# Patient Record
Sex: Female | Born: 1950 | Race: Black or African American | Hispanic: No | Smoking: Never smoker
Health system: Southern US, Community
[De-identification: ages and names within clinical notes are randomized; demographics above are authoritative.]

## PROBLEM LIST (undated history)

## (undated) DIAGNOSIS — K5792 Diverticulitis of intestine, part unspecified, without perforation or abscess without bleeding: Secondary | ICD-10-CM

## (undated) DIAGNOSIS — R911 Solitary pulmonary nodule: Secondary | ICD-10-CM

## (undated) DIAGNOSIS — M1990 Unspecified osteoarthritis, unspecified site: Secondary | ICD-10-CM

## (undated) DIAGNOSIS — I10 Essential (primary) hypertension: Secondary | ICD-10-CM

## (undated) HISTORY — PX: EAR CYST EXCISION: SHX22

## (undated) HISTORY — PX: TONSILLECTOMY: SUR1361

---

## 2009-03-15 ENCOUNTER — Emergency Department (HOSPITAL_BASED_OUTPATIENT_CLINIC_OR_DEPARTMENT_OTHER): Admission: EM | Admit: 2009-03-15 | Discharge: 2009-03-15 | Payer: Self-pay | Admitting: Emergency Medicine

## 2010-05-09 ENCOUNTER — Emergency Department (INDEPENDENT_AMBULATORY_CARE_PROVIDER_SITE_OTHER): Payer: Self-pay

## 2010-05-09 ENCOUNTER — Emergency Department (HOSPITAL_BASED_OUTPATIENT_CLINIC_OR_DEPARTMENT_OTHER)
Admission: EM | Admit: 2010-05-09 | Discharge: 2010-05-09 | Disposition: A | Discharge status: Home / Self Care | Payer: Self-pay | Attending: Emergency Medicine | Admitting: Emergency Medicine

## 2010-05-09 DIAGNOSIS — M1710 Unilateral primary osteoarthritis, unspecified knee: Secondary | ICD-10-CM | POA: Insufficient documentation

## 2010-05-09 DIAGNOSIS — M25569 Pain in unspecified knee: Secondary | ICD-10-CM

## 2017-06-24 ENCOUNTER — Emergency Department (HOSPITAL_BASED_OUTPATIENT_CLINIC_OR_DEPARTMENT_OTHER)
Admission: EM | Admit: 2017-06-24 | Discharge: 2017-06-24 | Disposition: A | Discharge status: Home / Self Care | Payer: Medicare Other | Attending: Physician Assistant | Admitting: Physician Assistant

## 2017-06-24 ENCOUNTER — Encounter (HOSPITAL_BASED_OUTPATIENT_CLINIC_OR_DEPARTMENT_OTHER): Payer: Self-pay

## 2017-06-24 ENCOUNTER — Other Ambulatory Visit: Payer: Self-pay

## 2017-06-24 ENCOUNTER — Emergency Department (HOSPITAL_BASED_OUTPATIENT_CLINIC_OR_DEPARTMENT_OTHER): Payer: Medicare Other

## 2017-06-24 DIAGNOSIS — M25561 Pain in right knee: Secondary | ICD-10-CM | POA: Insufficient documentation

## 2017-06-24 DIAGNOSIS — M25551 Pain in right hip: Secondary | ICD-10-CM | POA: Diagnosis not present

## 2017-06-24 MED ORDER — IBUPROFEN 400 MG PO TABS
600.0000 mg | ORAL_TABLET | Freq: Once | ORAL | Status: AC
  Administered 2017-06-24: 600 mg via ORAL
  Filled 2017-06-24: qty 1

## 2017-06-24 NOTE — Discharge Instructions (Addendum)
Please read instructions below. Apply ice to your hip and knee for 20 minutes at a time. Elevate your leg when possible. You can take advil every 6 hours as needed for pain. Schedule an appointment with primary care to discuss your slow heart rate.  Return to the ER for lightheadedness or weakness, chest pain, or new or concerning symptoms.
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
## 2017-06-24 NOTE — ED Notes (Signed)
Patient transported to X-ray 

## 2017-06-24 NOTE — ED Notes (Signed)
Pt's family member concerned about the slow HR. Pt denies any CP, lightheadedness, ShOB, or nausea.
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
## 2019-10-12 IMAGING — CR DG HIP (WITH OR WITHOUT PELVIS) 2-3V*R*
3 series · 3 of 3 positions shown · non-contrast
Comparison: None.

CLINICAL DATA: RIGHT hip and groin pain after bumping into
grandson.

EXAM:
DG HIP (WITH OR WITHOUT PELVIS) 2-3V RIGHT

[t pelvis a.p.]
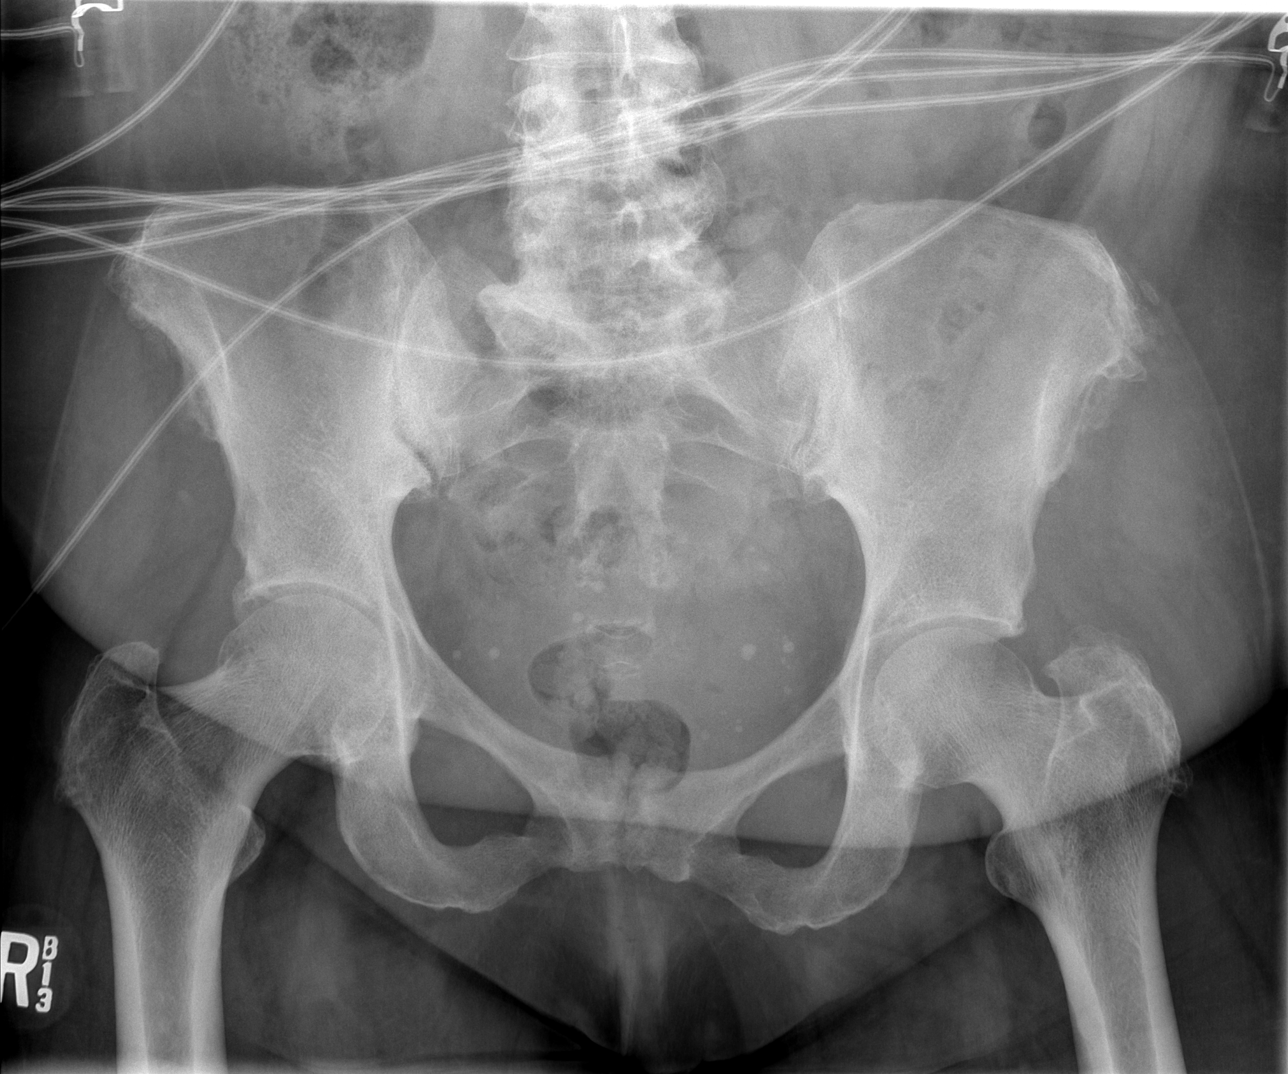

[t hip ap right]
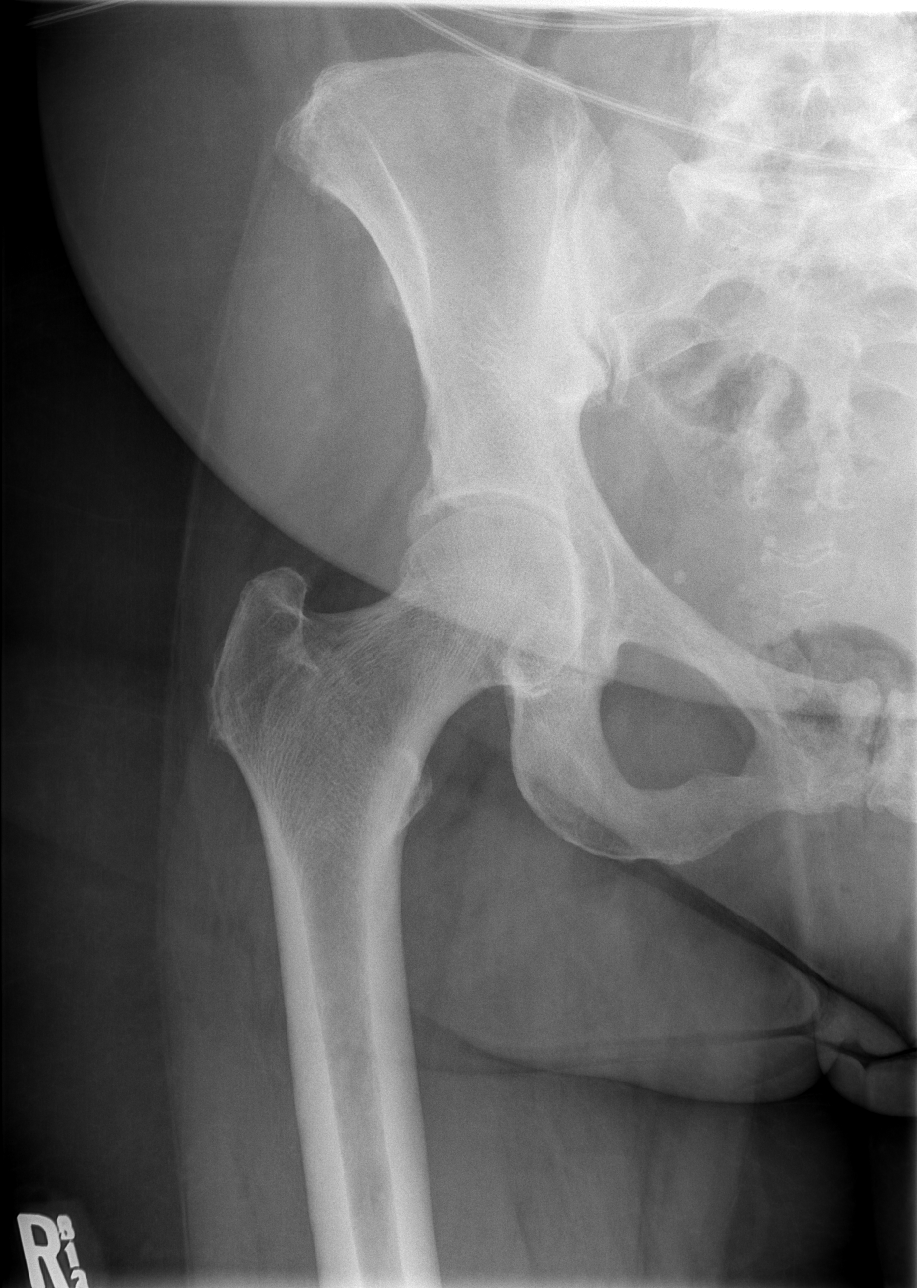

[t hip frog leg right]
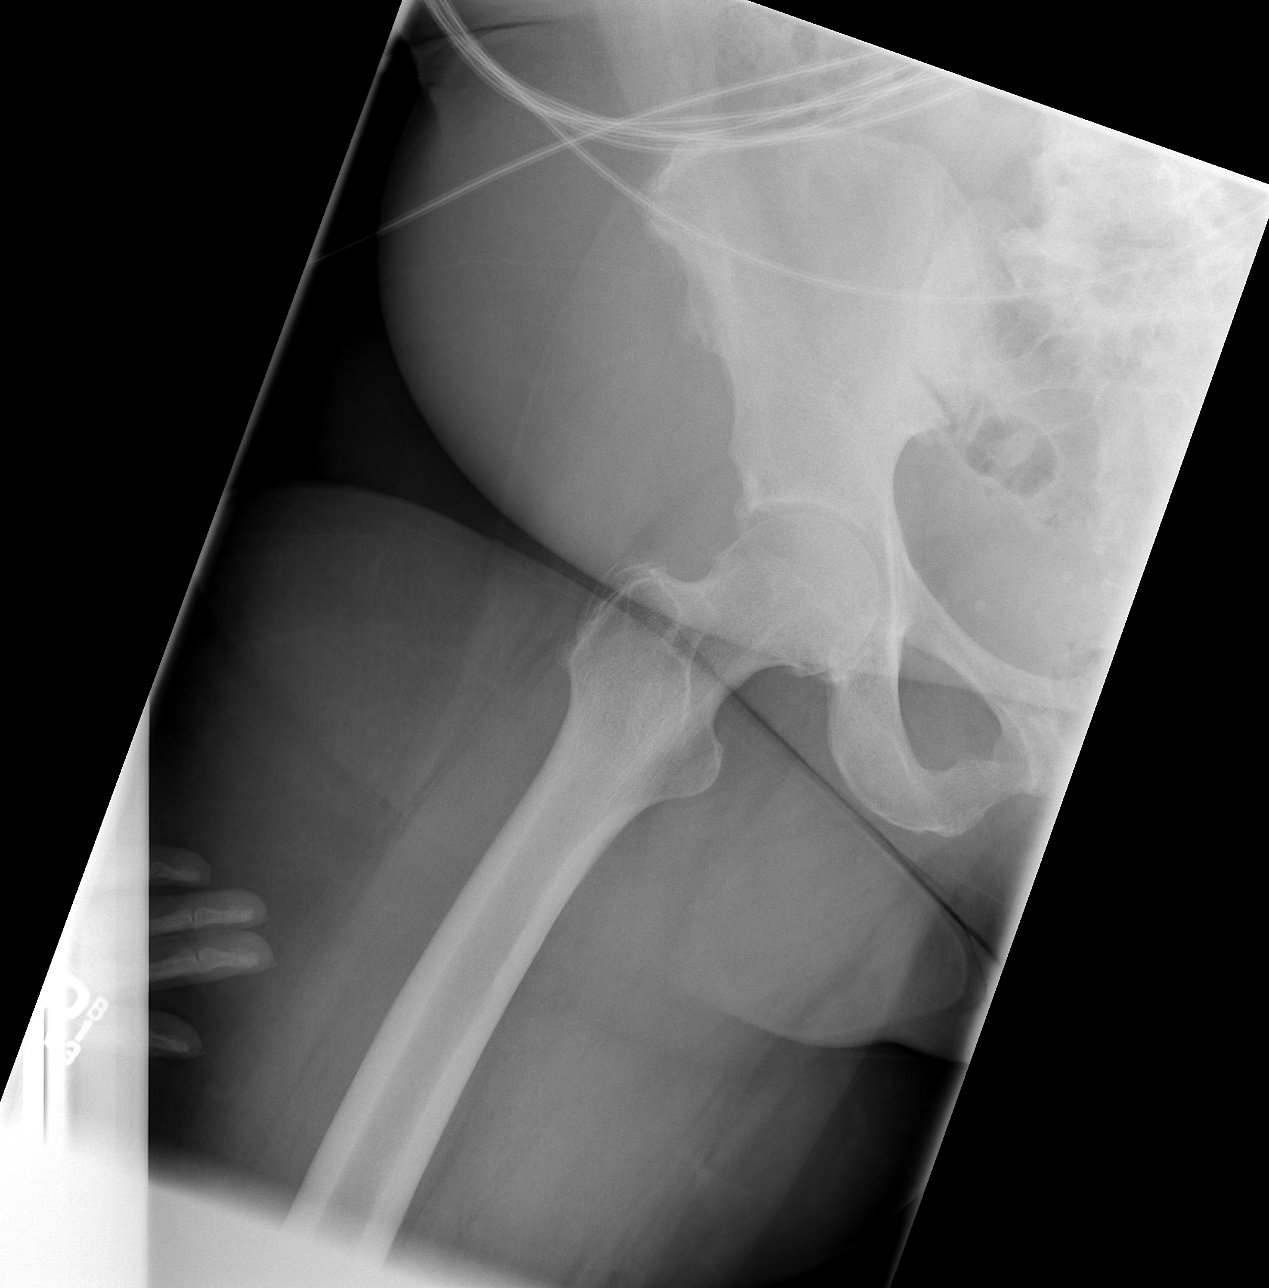

[3 of 3 positions shown; findings below may reference images not displayed]

FINDINGS: There is no evidence of hip fracture or dislocation. Mild sacroiliac
osteoarthrosis. Advanced pubic symphysis osteoarthrosis. There is no
evidence of arthropathy or other focal bone abnormality. Phleboliths
project in the pelvis.
IMPRESSION: Negative.

## 2020-05-14 ENCOUNTER — Other Ambulatory Visit: Payer: Self-pay | Admitting: Orthopedic Surgery

## 2020-05-15 ENCOUNTER — Encounter (HOSPITAL_BASED_OUTPATIENT_CLINIC_OR_DEPARTMENT_OTHER): Payer: Self-pay | Admitting: Orthopedic Surgery

## 2020-05-15 ENCOUNTER — Other Ambulatory Visit: Payer: Self-pay

## 2020-05-18 ENCOUNTER — Other Ambulatory Visit (HOSPITAL_COMMUNITY)
Admission: RE | Admit: 2020-05-18 | Discharge: 2020-05-18 | Disposition: A | Discharge status: Home / Self Care | Payer: Medicare Other | Source: Ambulatory Visit | Attending: Orthopedic Surgery | Admitting: Orthopedic Surgery

## 2020-05-18 DIAGNOSIS — Z01812 Encounter for preprocedural laboratory examination: Secondary | ICD-10-CM | POA: Diagnosis present

## 2020-05-18 DIAGNOSIS — Z20822 Contact with and (suspected) exposure to covid-19: Secondary | ICD-10-CM | POA: Diagnosis not present

## 2020-05-19 LAB — SARS CORONAVIRUS 2 (TAT 6-24 HRS): SARS Coronavirus 2: NEGATIVE

## 2020-05-22 ENCOUNTER — Encounter (HOSPITAL_BASED_OUTPATIENT_CLINIC_OR_DEPARTMENT_OTHER): Payer: Self-pay | Admitting: Orthopedic Surgery

## 2020-05-22 ENCOUNTER — Encounter (HOSPITAL_BASED_OUTPATIENT_CLINIC_OR_DEPARTMENT_OTHER)
Admission: RE | Disposition: A | Discharge status: Home / Self Care | Payer: Self-pay | Source: Home / Self Care | Attending: Orthopedic Surgery

## 2020-05-22 ENCOUNTER — Other Ambulatory Visit: Payer: Self-pay

## 2020-05-22 ENCOUNTER — Ambulatory Visit (HOSPITAL_BASED_OUTPATIENT_CLINIC_OR_DEPARTMENT_OTHER): Payer: Medicare Other | Admitting: Anesthesiology

## 2020-05-22 ENCOUNTER — Ambulatory Visit (HOSPITAL_BASED_OUTPATIENT_CLINIC_OR_DEPARTMENT_OTHER)
Admission: RE | Admit: 2020-05-22 | Discharge: 2020-05-22 | Disposition: A | Discharge status: Home / Self Care | Payer: Medicare Other | Attending: Orthopedic Surgery | Admitting: Orthopedic Surgery

## 2020-05-22 DIAGNOSIS — M67431 Ganglion, right wrist: Secondary | ICD-10-CM | POA: Insufficient documentation

## 2020-05-22 DIAGNOSIS — Z885 Allergy status to narcotic agent status: Secondary | ICD-10-CM | POA: Insufficient documentation

## 2020-05-22 HISTORY — DX: Unspecified osteoarthritis, unspecified site: M19.90

## 2020-05-22 HISTORY — DX: Essential (primary) hypertension: I10

## 2020-05-22 HISTORY — DX: Solitary pulmonary nodule: R91.1

## 2020-05-22 HISTORY — PX: GANGLION CYST EXCISION: SHX1691

## 2020-05-22 HISTORY — DX: Diverticulitis of intestine, part unspecified, without perforation or abscess without bleeding: K57.92

## 2020-05-22 SURGERY — EXCISION, GANGLION CYST, WRIST
Anesthesia: Monitor Anesthesia Care | Site: Wrist | Laterality: Right

## 2020-05-22 MED ORDER — PROPOFOL 10 MG/ML IV BOLUS
INTRAVENOUS | Status: DC | PRN
  Administered 2020-05-22: 10 mg via INTRAVENOUS

## 2020-05-22 MED ORDER — GLYCOPYRROLATE 0.2 MG/ML IJ SOLN
INTRAMUSCULAR | Status: DC | PRN
  Administered 2020-05-22: .2 mg via INTRAVENOUS

## 2020-05-22 MED ORDER — FENTANYL CITRATE (PF) 100 MCG/2ML IJ SOLN
INTRAMUSCULAR | Status: DC | PRN
  Administered 2020-05-22 (×2): 50 ug via INTRAVENOUS

## 2020-05-22 MED ORDER — CEFAZOLIN SODIUM-DEXTROSE 2-4 GM/100ML-% IV SOLN
INTRAVENOUS | Status: AC
  Filled 2020-05-22: qty 100

## 2020-05-22 MED ORDER — CEFAZOLIN SODIUM-DEXTROSE 2-4 GM/100ML-% IV SOLN
2.0000 g | INTRAVENOUS | Status: AC
  Administered 2020-05-22: 2 g via INTRAVENOUS

## 2020-05-22 MED ORDER — ONDANSETRON HCL 4 MG/2ML IJ SOLN
INTRAMUSCULAR | Status: AC
  Filled 2020-05-22: qty 2

## 2020-05-22 MED ORDER — MIDAZOLAM HCL 5 MG/5ML IJ SOLN
INTRAMUSCULAR | Status: DC | PRN
  Administered 2020-05-22 (×2): 1 mg via INTRAVENOUS

## 2020-05-22 MED ORDER — LACTATED RINGERS IV SOLN: INTRAVENOUS | Status: DC

## 2020-05-22 MED ORDER — LIDOCAINE HCL (PF) 0.5 % IJ SOLN
INTRAMUSCULAR | Status: DC | PRN
  Administered 2020-05-22: 50 mL via INTRAVENOUS

## 2020-05-22 MED ORDER — MIDAZOLAM HCL 2 MG/2ML IJ SOLN
INTRAMUSCULAR | Status: AC
  Filled 2020-05-22: qty 2

## 2020-05-22 MED ORDER — FENTANYL CITRATE (PF) 100 MCG/2ML IJ SOLN
INTRAMUSCULAR | Status: AC
  Filled 2020-05-22: qty 2

## 2020-05-22 MED ORDER — PROPOFOL 500 MG/50ML IV EMUL
INTRAVENOUS | Status: DC | PRN
  Administered 2020-05-22: 50 ug/kg/min via INTRAVENOUS

## 2020-05-22 MED ORDER — TRAMADOL HCL 50 MG PO TABS: 50.0000 mg | ORAL_TABLET | Freq: Four times a day (QID) | ORAL | 0 refills | Status: AC | PRN

## 2020-05-22 MED ORDER — BUPIVACAINE HCL (PF) 0.25 % IJ SOLN
INTRAMUSCULAR | Status: DC | PRN
  Administered 2020-05-22: 5 mL

## 2020-05-22 MED ORDER — ONDANSETRON HCL 4 MG/2ML IJ SOLN
INTRAMUSCULAR | Status: DC | PRN
  Administered 2020-05-22: 4 mg via INTRAVENOUS

## 2020-05-22 SURGICAL SUPPLY — 39 items
BLADE MINI RND TIP GREEN BEAV (BLADE) IMPLANT
BLADE SURG 15 STRL LF DISP TIS (BLADE) ×1 IMPLANT
BLADE SURG 15 STRL SS (BLADE) ×1
BNDG COHESIVE 3X5 TAN STRL LF (GAUZE/BANDAGES/DRESSINGS) ×2 IMPLANT
BNDG ESMARK 4X9 LF (GAUZE/BANDAGES/DRESSINGS) IMPLANT
BNDG GAUZE ELAST 4 BULKY (GAUZE/BANDAGES/DRESSINGS) ×2 IMPLANT
CHLORAPREP W/TINT 26 (MISCELLANEOUS) ×2 IMPLANT
CORD BIPOLAR FORCEPS 12FT (ELECTRODE) ×2 IMPLANT
COVER BACK TABLE 60X90IN (DRAPES) ×2 IMPLANT
COVER MAYO STAND STRL (DRAPES) ×2 IMPLANT
COVER WAND RF STERILE (DRAPES) IMPLANT
CUFF TOURN SGL QUICK 18X4 (TOURNIQUET CUFF) IMPLANT
DECANTER SPIKE VIAL GLASS SM (MISCELLANEOUS) IMPLANT
DRAPE EXTREMITY T 121X128X90 (DISPOSABLE) ×2 IMPLANT
DRAPE SURG 17X23 STRL (DRAPES) ×2 IMPLANT
GAUZE SPONGE 4X4 12PLY STRL (GAUZE/BANDAGES/DRESSINGS) ×2 IMPLANT
GAUZE XEROFORM 1X8 LF (GAUZE/BANDAGES/DRESSINGS) ×2 IMPLANT
GLOVE SURG ORTHO LTX SZ8 (GLOVE) ×2 IMPLANT
GLOVE SURG UNDER POLY LF SZ8.5 (GLOVE) ×2 IMPLANT
GOWN STRL REUS W/ TWL LRG LVL3 (GOWN DISPOSABLE) ×1 IMPLANT
GOWN STRL REUS W/TWL LRG LVL3 (GOWN DISPOSABLE) ×1
GOWN STRL REUS W/TWL XL LVL3 (GOWN DISPOSABLE) ×2 IMPLANT
NEEDLE PRECISIONGLIDE 27X1.5 (NEEDLE) ×2 IMPLANT
NS IRRIG 1000ML POUR BTL (IV SOLUTION) ×2 IMPLANT
PACK BASIN DAY SURGERY FS (CUSTOM PROCEDURE TRAY) ×2 IMPLANT
PAD CAST 3X4 CTTN HI CHSV (CAST SUPPLIES) ×1 IMPLANT
PADDING CAST ABS 4INX4YD NS (CAST SUPPLIES) ×1
PADDING CAST ABS COTTON 4X4 ST (CAST SUPPLIES) ×1 IMPLANT
PADDING CAST COTTON 3X4 STRL (CAST SUPPLIES) ×1
SPLINT PLASTER CAST XFAST 3X15 (CAST SUPPLIES) ×5 IMPLANT
SPLINT PLASTER XTRA FASTSET 3X (CAST SUPPLIES) ×5
STOCKINETTE 4X48 STRL (DRAPES) ×2 IMPLANT
SUT ETHILON 4 0 PS 2 18 (SUTURE) ×2 IMPLANT
SUT VIC AB 4-0 P2 18 (SUTURE) IMPLANT
SUT VICRYL 4-0 PS2 18IN ABS (SUTURE) IMPLANT
SYR BULB EAR ULCER 3OZ GRN STR (SYRINGE) ×2 IMPLANT
SYR CONTROL 10ML LL (SYRINGE) ×2 IMPLANT
TOWEL GREEN STERILE FF (TOWEL DISPOSABLE) ×4 IMPLANT
UNDERPAD 30X36 HEAVY ABSORB (UNDERPADS AND DIAPERS) ×2 IMPLANT

## 2020-05-22 NOTE — Anesthesia Preprocedure Evaluation (Signed)
Anesthesia Evaluation  Patient identified by MRN, date of birth, ID band Patient awake    Reviewed: Allergy & Precautions, NPO status , Patient's Chart, lab work & pertinent test results  History of Anesthesia Complications Negative for: history of anesthetic complications  Airway Mallampati: II  TM Distance: >3 FB Neck ROM: Full    Dental   Pulmonary neg pulmonary ROS,    Pulmonary exam normal        Cardiovascular hypertension, Normal cardiovascular exam     Neuro/Psych negative neurological ROS     GI/Hepatic negative GI ROS, Neg liver ROS,   Endo/Other  negative endocrine ROS  Renal/GU negative Renal ROS  negative genitourinary   Musculoskeletal  (+) Arthritis ,   Abdominal   Peds  Hematology negative hematology ROS (+)   Anesthesia Other Findings   Reproductive/Obstetrics                            Anesthesia Physical Anesthesia Plan  ASA: II  Anesthesia Plan: MAC and Bier Block and Bier Block-LIDOCAINE ONLY   Post-op Pain Management:    Induction: Intravenous  PONV Risk Score and Plan: 2 and Propofol infusion, TIVA and Treatment may vary due to age or medical condition  Airway Management Planned: Natural Airway, Nasal Cannula and Simple Face Mask  Additional Equipment: None  Intra-op Plan:   Post-operative Plan:   Informed Consent: I have reviewed the patients History and Physical, chart, labs and discussed the procedure including the risks, benefits and alternatives for the proposed anesthesia with the patient or authorized representative who has indicated his/her understanding and acceptance.       Plan Discussed with:   Anesthesia Plan Comments:         Anesthesia Quick Evaluation

## 2020-05-22 NOTE — Discharge Instructions (Addendum)

## 2020-05-22 NOTE — Transfer of Care (Signed)
Immediate Anesthesia Transfer of Care Note  Patient: Monica Frederick  Procedure(s) Performed: EXCISION VOLAR MASS RIGHT WRIST (Right Wrist)  Patient Location: PACU  Anesthesia Type:MAC and Bier block  Level of Consciousness: sedated  Airway & Oxygen Therapy: Patient Spontanous Breathing and Patient connected to face mask oxygen  Post-op Assessment: Report given to RN and Post -op Vital signs reviewed and stable  Post vital signs: Reviewed and stable  Last Vitals:  Vitals Value Taken Time  BP 130/65 05/22/20 1215  Temp    Pulse 44 05/22/20 1216  Resp 18 05/22/20 1216  SpO2 100 % 05/22/20 1216  Vitals shown include unvalidated device data.  Last Pain:  Vitals:   05/22/20 1025  TempSrc: Oral  PainSc: 0-No pain         Complications: No complications documented.
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
## 2020-05-22 NOTE — Anesthesia Procedure Notes (Signed)
Anesthesia Regional Block: Bier block (IV Regional)   Pre-Anesthetic Checklist: ,, timeout performed, Correct Patient, Correct Site, Correct Laterality, Correct Procedure,, site marked, surgical consent,, at surgeon's request  Laterality: Right     Needles:  Injection technique: Single-shot  Needle Type: Other      Needle Gauge: 20     Additional Needles:   Procedures:,,,,, intact distal pulses, Esmarch exsanguination, single tourniquet utilized,  Narrative:  Start time: 05/22/2020 11:30 AM End time: 05/22/2020 11:30 AM  Performed by: Personally

## 2020-05-22 NOTE — Anesthesia Postprocedure Evaluation (Signed)
Anesthesia Post Note  Patient: Monica Frederick  Procedure(s) Performed: EXCISION VOLAR MASS RIGHT WRIST (Right Wrist)     Patient location during evaluation: PACU Anesthesia Type: MAC Level of consciousness: awake and alert Pain management: pain level controlled Vital Signs Assessment: post-procedure vital signs reviewed and stable Respiratory status: spontaneous breathing, nonlabored ventilation and respiratory function stable Cardiovascular status: blood pressure returned to baseline and stable Postop Assessment: no apparent nausea or vomiting Anesthetic complications: no   No complications documented.  Last Vitals:  Vitals:   05/22/20 1215 05/22/20 1230  BP: 130/65 (!) 169/71  Pulse: (!) 44 (!) 49  Resp: 20 19  Temp: (!) 36.4 C   SpO2: 100% 99%    Last Pain:  Vitals:   05/22/20 1215  TempSrc:   PainSc: Asleep                 Lidia Collum

## 2020-05-22 NOTE — Brief Op Note (Signed)
05/22/2020  12:09 PM  PATIENT:  Monica Frederick  70 y.o. female  PRE-OPERATIVE DIAGNOSIS:  VOLAR MASS RIGHT WRIST  POST-OPERATIVE DIAGNOSIS:  VOLAR MASS RIGHT WRIST  PROCEDURE:  Procedure(s): EXCISION VOLAR MASS RIGHT WRIST (Right)  SURGEON:  Surgeon(s) and Role:    * Cindee Salt, MD - Primary  PHYSICIAN ASSISTANT:   ASSISTANTS: none   ANESTHESIA:   local, regional and IV sedation  EBL: 65ml BLOOD ADMINISTERED:none  DRAINS: none   LOCAL MEDICATIONS USED:  BUPIVICAINE   SPECIMEN:  Excision  DISPOSITION OF SPECIMEN:  PATHOLOGY  COUNTS:  YES  TOURNIQUET:   Total Tourniquet Time Documented: Upper Arm (Right) - 37 minutes Total: Upper Arm (Right) - 37 minutes   DICTATION: .Reubin Milan Dictation  PLAN OF CARE: Discharge to home after PACU  PATIENT DISPOSITION:  PACU - hemodynamically stable.

## 2020-05-22 NOTE — Op Note (Signed)
NAMECharne Frederick MEDICAL RECORD NO: [id number] DATE OF BIRTH: [birth year] FACILITY: [facility] LOCATION: [facility] PHYSICIAN: Nicki Reaper, MD   OPERATIVE REPORT   DATE OF PROCEDURE: 05/22/20    PREOPERATIVE DIAGNOSIS:   Volar radial ganglion right wrist   POSTOPERATIVE DIAGNOSIS:   Same   PROCEDURE:   Excision volar radial ganglion right wrist   SURGEON: Cindee Salt, M.D.   ASSISTANT: none   ANESTHESIA:  Bier block with sedation and Local   INTRAVENOUS FLUIDS:  Per anesthesia flow sheet.   ESTIMATED BLOOD LOSS:  Minimal.   COMPLICATIONS:  None.   SPECIMENS:   Volar radial wrist ganglion   TOURNIQUET TIME:    Total Tourniquet Time Documented: Upper Arm (Right) - 37 minutes Total: Upper Arm (Right) - 37 minutes    DISPOSITION:  Stable to PACU.   INDICATIONS: Patient is a 70 year old female with a long history of a mass on the volar radial aspect of her right wrist.  This is pulsatile in nature but appears to be separated from the artery.  It does transilluminate.  She is desires having this removed.  Prepare postoperative course have been discussed along with risks and complications.  She is aware there is no guarantee to the surgery the possibility of infection recurrence injury to arteries nerves tendons complete relief of symptoms and dystrophy possibility of 20% recurrence rate.  Preoperative area the patient seen the extremity marked by both patient and surgeon antibiotic given  OPERATIVE COURSE: Patient is brought to the operating room placed in the supine position with the right arm free.  A upper arm IV regional anesthetic was carried out without difficulty under the direction of the anesthesia department.  She was prepped with ChloraPrep.  A 3-minute dry time was allowed and timeout taken to confirm patient procedure.  A curvilinear incision was made over the mass carried down through subcutaneous tissue.  Bleeders were electrocauterized.  A large  cystic mass approximately 3-1/2 cm in length and 1/2 cm in width was immediately encountered.  This was intermittent to the radial artery.  The cyst was deflated the dissection was carried along its radial artery margin.  Taking care to protect neurovascular structures.  Veins were cauterized with bipolar.  The cyst was isolated and followed down into the radiocarpal joint entering it what appeared to be through the radioscaphocapitate ligament.  This area was minimally debrided with a rondure.  The specimen was sent to pathology.  The wound was again irrigated with saline.  The subcutaneous tissue was closed with interrupted 4-0 Vicryl sutures and the skin was closed with interrupted 4-0 nylon sutures.  Local infiltration with quarter percent bupivacaine without epinephrine was given approximately 7 cc was used.  A sterile compressive dressing volar splint was applied.  Deflation of the tourniquet all fingers immediately pink.  She was taken to the recovery room for observation in satisfactory condition.  She will be discharged home to return to the [facility] in 1 week on Tylenol with ibuprofen for pain and Ultram for breakthrough.   Cindee Salt, MD Electronically signed, 05/22/20

## 2020-05-23 ENCOUNTER — Encounter (HOSPITAL_BASED_OUTPATIENT_CLINIC_OR_DEPARTMENT_OTHER): Payer: Self-pay | Admitting: Orthopedic Surgery

## 2020-05-23 LAB — SURGICAL PATHOLOGY

## 2021-02-19 ENCOUNTER — Other Ambulatory Visit: Payer: Self-pay

## 2021-02-19 ENCOUNTER — Encounter (HOSPITAL_BASED_OUTPATIENT_CLINIC_OR_DEPARTMENT_OTHER): Payer: Self-pay | Admitting: *Deleted

## 2021-02-19 ENCOUNTER — Emergency Department (HOSPITAL_BASED_OUTPATIENT_CLINIC_OR_DEPARTMENT_OTHER)
Admission: EM | Admit: 2021-02-19 | Discharge: 2021-02-19 | Disposition: A | Discharge status: Home / Self Care | Payer: Medicare Other | Attending: Emergency Medicine | Admitting: Emergency Medicine

## 2021-02-19 DIAGNOSIS — I10 Essential (primary) hypertension: Secondary | ICD-10-CM | POA: Diagnosis not present

## 2021-02-19 NOTE — Discharge Instructions (Signed)
Please return for headache trouble with your vision one-sided numbness or weakness difficulty speech or swallowing.  Please return for chest pain or difficulty breathing.  Please do not stress out about your blood pressure at home.  Give your doctor call and follow-up with them in the office to have it rechecked.

## 2021-02-19 NOTE — ED Provider Notes (Signed)
[facility] EMERGENCY DEPARTMENT Provider Note   CSN: [id number] Arrival date & time: 02/19/21  1636     History  Chief Complaint  Patient presents with   Hypertension    Monica Frederick is a 71 y.o. female.  71 yo F with a cc of pain.  The patient had a home health nurse come out to her house today and she checked her blood pressure and it was elevated.  After multiple checks they had called her PCP who suggested that she go to the emergency department for evaluation.  Patient denies any symptoms.  Specifically denies chest pain difficulty breathing headache change in vision one-sided numbness or weakness difficulty with speech or swallowing.  She tells me that she has been having an unhealthy diet for the past couple days.  They have celebrated multiple birthdays and she is also had a recent death in the family that she thinks is stressing her out.  She has had a problem like this in the past where she ended up having hypertension noted in the office and after she had done some relaxation at home and fixed her diet she came back and her blood pressure had improved significantly and she was not started on medication.  She denies any medications that she takes at home.  Denies any supplements.  The history is provided by the patient.  Hypertension Pertinent negatives include no chest pain, no headaches and no shortness of breath.  Illness Severity:  Moderate Onset quality:  Gradual Duration:  2 days Timing:  Constant Progression:  Worsening Chronicity:  New Associated symptoms: no chest pain, no congestion, no fever, no headaches, no myalgias, no nausea, no rhinorrhea, no shortness of breath, no vomiting and no wheezing       Home Medications Prior to Admission medications   Medication Sig Start Date End Date Taking? Authorizing Provider  traMADol (ULTRAM) 50 MG tablet Take 1 tablet (50 mg total) by mouth every 6 (six) hours as needed. 05/22/20   Daryll Brod, MD       Allergies    Codeine    Review of Systems   Review of Systems  Constitutional:  Negative for chills and fever.  HENT:  Negative for congestion and rhinorrhea.   Eyes:  Negative for redness and visual disturbance.  Respiratory:  Negative for shortness of breath and wheezing.   Cardiovascular:  Negative for chest pain and palpitations.  Gastrointestinal:  Negative for nausea and vomiting.  Genitourinary:  Negative for dysuria and urgency.  Musculoskeletal:  Negative for arthralgias and myalgias.  Skin:  Negative for pallor and wound.  Neurological:  Negative for dizziness and headaches.   Physical Exam Updated Vital Signs BP (!) 214/69 (BP Location: Right Arm)    Pulse 62    Temp 97.9 F (36.6 C) (Oral)    Resp 20    Ht 5\' 8"  (1.727 m)    Wt 111.5 kg    SpO2 98%    BMI 37.38 kg/m  Physical Exam Vitals and nursing note reviewed.  Constitutional:      General: She is not in acute distress.    Appearance: She is well-developed. She is not diaphoretic.  HENT:     Head: Normocephalic and atraumatic.  Eyes:     Pupils: Pupils are equal, round, and reactive to light.  Cardiovascular:     Rate and Rhythm: Normal rate and regular rhythm.     Heart sounds: No murmur heard.   No friction rub. No  gallop.  Pulmonary:     Effort: Pulmonary effort is normal.     Breath sounds: No wheezing or rales.  Abdominal:     General: There is no distension.     Palpations: Abdomen is soft.     Tenderness: There is no abdominal tenderness.  Musculoskeletal:        General: No tenderness.     Cervical back: Normal range of motion and neck supple.  Skin:    General: Skin is warm and dry.  Neurological:     Mental Status: She is alert and oriented to person, place, and time.     GCS: GCS eye subscore is 4. GCS verbal subscore is 5. GCS motor subscore is 6.     Cranial Nerves: Cranial nerves 2-12 are intact.     Sensory: Sensation is intact.     Motor: Motor function is intact.      Coordination: Coordination is intact.     Comments: Benign neuro exam.   Psychiatric:        Behavior: Behavior normal.    ED Results / Procedures / Treatments   Labs (all labs ordered are listed, but only abnormal results are displayed) Labs Reviewed - No data to display  EKG None  Radiology No results found.  Procedures Procedures    Medications Ordered in ED Medications - No data to display  ED Course/ Medical Decision Making/ A&P                           Medical Decision Making  71 yo F with a chief complaints of hypertension.  She is asymptomatic.  On my record review almost all of the blood pressures that have been checked in the past few years have been in the 99991111 systolic range.  I do not think that this is much different than her baseline.  She has never been on any antihypertensive medications.  She plans to follow-up with her PCP this week.  We will hold off on starting her on anything at this time.  Patient asymptomatic with no noted s/s of end organ damage.  No chest pain, diaphoresis, nausea or other acs symptoms.  No headache or neurologic complaints,  no unequal pulses, normal pulse ox without rales or sob.  Feel this is unlikely to be a Hypertensive Emergency and recent studies suggest no benefit for inpatient admission.  There are also no studies to my knowledge suggesting that patients with hypertensive urgency have increased risk for end organ disease. In fact there has been a study recently that would suggest that the rapid change can induce harm.  The [employer] policy statement on asymptomatic hypertension does not  recommend routing ED medical intervention. The patient will follow up closely with their PCP.  Compliance with their medication stressed.    Lowell Guitar, Cicero Duck EH, et al. Characteristics and outcomes of patients presenting with hypertensive urgency in the office setting. JAMA Intern Med. 2016 Jul 1; 176(7):  981-8.   Cerebrovascular risks with rapid blood pressure lowering in the absence of hypertensive emergency Narda Bonds, Wende Crease, et al. Am J Emerg Med. 2019;37(6):1073-1077.  5:18 PM:  I have discussed the diagnosis/risks/treatment options with the patient and family and believe the pt to be eligible for discharge home to follow-up with PCP. We also discussed returning to the ED immediately if new or worsening sx occur. We discussed the sx which  are most concerning (e.g., sudden worsening pain, fever, inability to tolerate by mouth, chest pain, sob, stroke s/sx) that necessitate immediate return. Medications administered to the patient during their visit and any new prescriptions provided to the patient are listed below.  Medications given during this visit Medications - No data to display   The patient appears reasonably screen and/or stabilized for discharge and I doubt any other medical condition or other [facility] requiring further screening, evaluation, or treatment in the ED at this time prior to discharge.          Final Clinical Impression(s) / ED Diagnoses Final diagnoses:  Asymptomatic hypertension    Rx / DC Orders ED Discharge Orders     None         Deno Etienne, DO 02/19/21 1718

## 2021-02-19 NOTE — ED Triage Notes (Addendum)
Home health nurse took her BP today and it was elevated. She has not symptoms.
